# Patient Record
Sex: Male | Born: 1980 | Race: White | Hispanic: No | Marital: Single | Smoking: Never smoker
Health system: Southern US, Community
[De-identification: ages and names within clinical notes are randomized; demographics above are authoritative.]

## PROBLEM LIST (undated history)

## (undated) DIAGNOSIS — N201 Calculus of ureter: Secondary | ICD-10-CM

## (undated) HISTORY — PX: HERNIA REPAIR: SHX51

---

## 2009-12-10 ENCOUNTER — Encounter: Admission: RE | Admit: 2009-12-10 | Discharge: 2009-12-10 | Payer: Self-pay | Admitting: Family Medicine

## 2011-10-13 IMAGING — CR DG STERNUM 2+V
2 series · 2 of 2 positions shown · non-contrast
Comparison: None

CLINICAL DATA: Swelling over the lower aspect of the sternum.

STERNUM - 2+ VIEW

[view not recorded (1 of 2)]
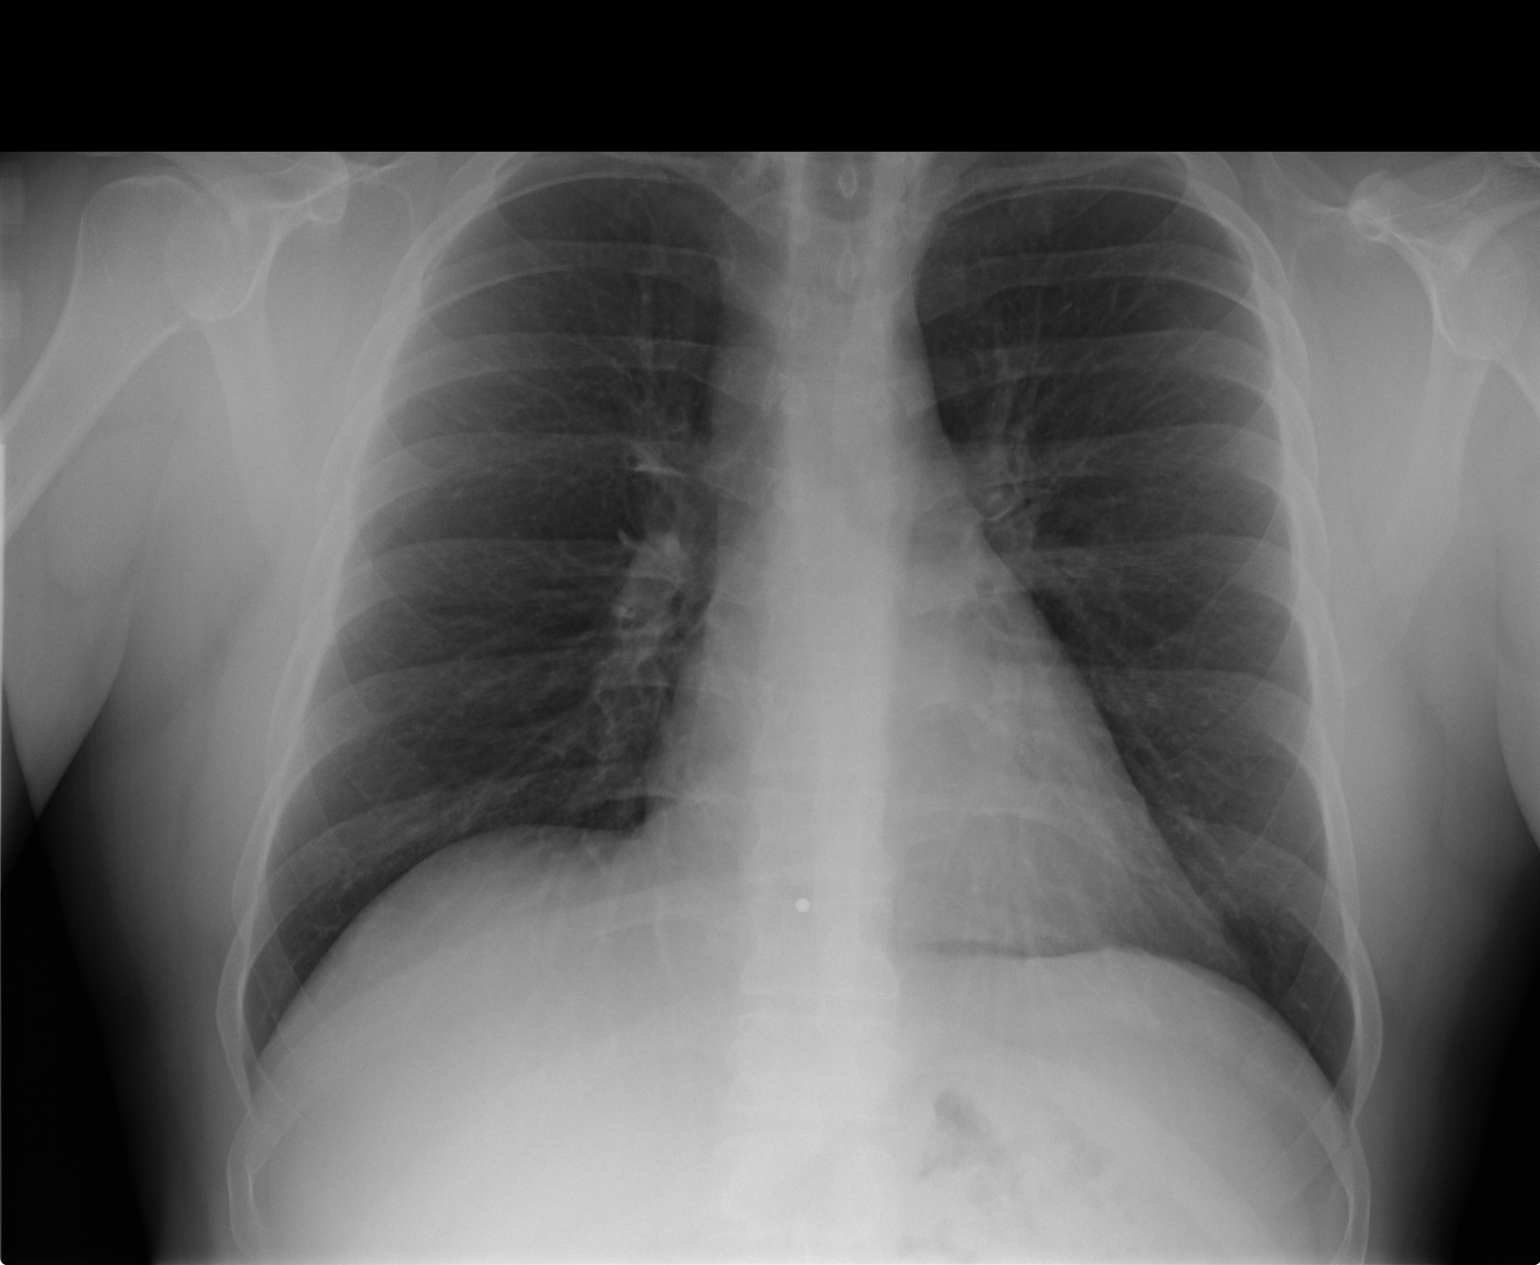

[view not recorded (2 of 2)]
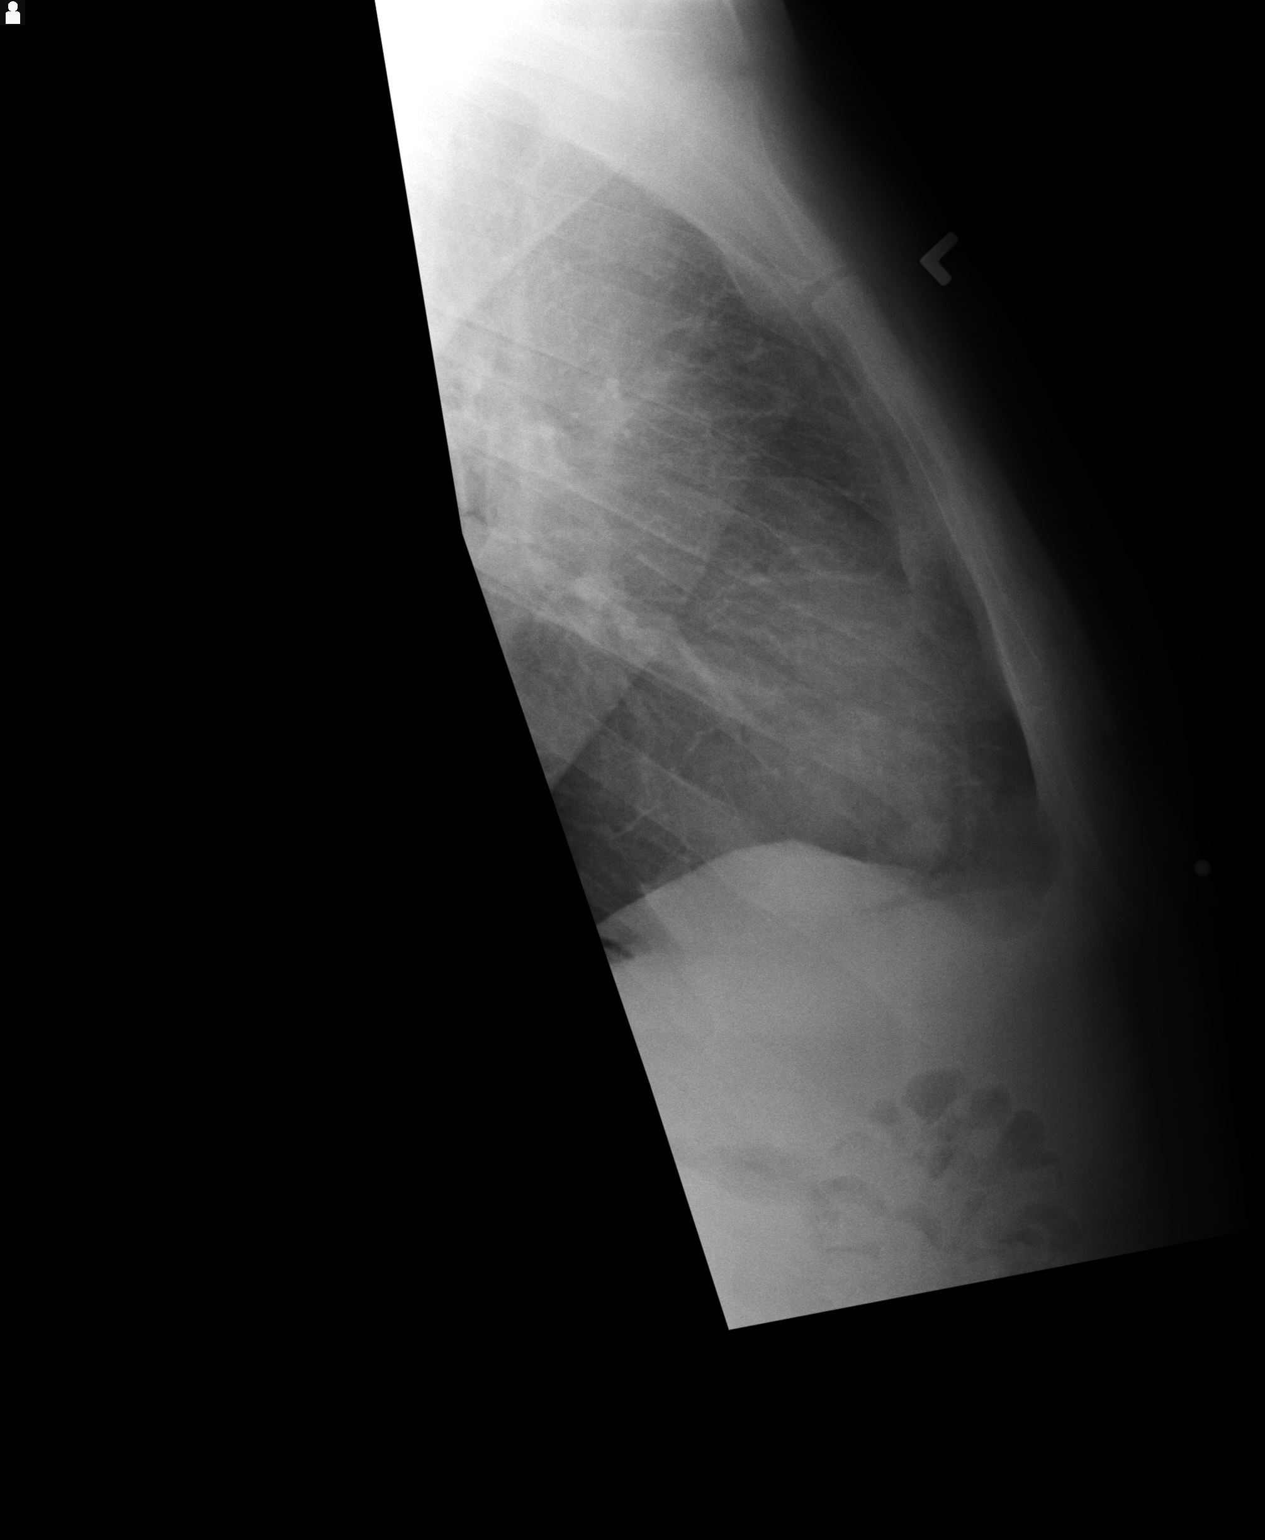

[2 of 2 positions shown; findings below may reference images not displayed]

FINDINGS: The cardiac silhouette, mediastinal and hilar contours
are within normal limits.  Lungs are clear.  The bony thorax is
intact.  No sternal abnormality is seen.  There is a somewhat
elongated xyphoid process which could account for the palpable
abnormality.
IMPRESSION: 1.  No acute cardiopulmonary findings.
2.  Elongated xyphoid process may correlate with the patient's
palpable abnormality.

## 2017-04-25 DIAGNOSIS — H6121 Impacted cerumen, right ear: Secondary | ICD-10-CM | POA: Diagnosis not present

## 2017-07-24 DIAGNOSIS — Z23 Encounter for immunization: Secondary | ICD-10-CM | POA: Diagnosis not present

## 2018-06-07 DIAGNOSIS — D225 Melanocytic nevi of trunk: Secondary | ICD-10-CM | POA: Diagnosis not present

## 2018-06-07 DIAGNOSIS — L720 Epidermal cyst: Secondary | ICD-10-CM | POA: Diagnosis not present

## 2018-06-07 DIAGNOSIS — L821 Other seborrheic keratosis: Secondary | ICD-10-CM | POA: Diagnosis not present

## 2018-12-07 DIAGNOSIS — K602 Anal fissure, unspecified: Secondary | ICD-10-CM | POA: Diagnosis not present

## 2019-05-31 ENCOUNTER — Other Ambulatory Visit: Payer: Self-pay

## 2019-05-31 ENCOUNTER — Other Ambulatory Visit: Payer: Self-pay | Admitting: Family Medicine

## 2019-05-31 DIAGNOSIS — Z20822 Contact with and (suspected) exposure to covid-19: Secondary | ICD-10-CM

## 2019-06-01 LAB — NOVEL CORONAVIRUS, NAA: SARS-CoV-2, NAA: NOT DETECTED

## 2022-09-23 ENCOUNTER — Other Ambulatory Visit (HOSPITAL_COMMUNITY): Payer: Self-pay

## 2022-09-23 MED ORDER — LISDEXAMFETAMINE DIMESYLATE 30 MG PO CAPS
30.0000 mg | ORAL_CAPSULE | Freq: Every day | ORAL | 0 refills | Status: DC
  Filled 2022-09-23 – 2022-10-26 (×2): qty 30, 30d supply, fill #0

## 2022-10-26 ENCOUNTER — Other Ambulatory Visit (HOSPITAL_COMMUNITY): Payer: Self-pay

## 2022-11-01 ENCOUNTER — Other Ambulatory Visit: Payer: Self-pay

## 2022-11-01 ENCOUNTER — Emergency Department (HOSPITAL_BASED_OUTPATIENT_CLINIC_OR_DEPARTMENT_OTHER)
Admission: EM | Admit: 2022-11-01 | Discharge: 2022-11-01 | Disposition: A | Discharge status: Home / Self Care | Payer: 59 | Attending: Emergency Medicine | Admitting: Emergency Medicine

## 2022-11-01 ENCOUNTER — Emergency Department (HOSPITAL_BASED_OUTPATIENT_CLINIC_OR_DEPARTMENT_OTHER): Payer: 59

## 2022-11-01 ENCOUNTER — Encounter (HOSPITAL_BASED_OUTPATIENT_CLINIC_OR_DEPARTMENT_OTHER): Payer: Self-pay

## 2022-11-01 DIAGNOSIS — R1032 Left lower quadrant pain: Secondary | ICD-10-CM | POA: Diagnosis present

## 2022-11-01 DIAGNOSIS — N132 Hydronephrosis with renal and ureteral calculous obstruction: Secondary | ICD-10-CM | POA: Diagnosis not present

## 2022-11-01 DIAGNOSIS — N201 Calculus of ureter: Secondary | ICD-10-CM

## 2022-11-01 HISTORY — DX: Calculus of ureter: N20.1

## 2022-11-01 LAB — URINALYSIS, ROUTINE W REFLEX MICROSCOPIC
Bacteria, UA: NONE SEEN
Bilirubin Urine: NEGATIVE
Glucose, UA: NEGATIVE mg/dL
Ketones, ur: NEGATIVE mg/dL
Leukocytes,Ua: NEGATIVE
Nitrite: NEGATIVE
Protein, ur: NEGATIVE mg/dL
Specific Gravity, Urine: 1.02 (ref 1.005–1.030)
pH: 5.5 (ref 5.0–8.0)

## 2022-11-01 MED ORDER — HYDROMORPHONE HCL 1 MG/ML IJ SOLN
1.0000 mg | Freq: Once | INTRAMUSCULAR | Status: AC
  Administered 2022-11-01: 1 mg via INTRAVENOUS
  Filled 2022-11-01: qty 1

## 2022-11-01 MED ORDER — ONDANSETRON 8 MG PO TBDP: 8.0000 mg | ORAL_TABLET | Freq: Three times a day (TID) | ORAL | 1 refills | Status: AC | PRN

## 2022-11-01 MED ORDER — HYDROMORPHONE HCL 2 MG PO TABS: 2.0000 mg | ORAL_TABLET | ORAL | 0 refills | Status: AC | PRN

## 2022-11-01 MED ORDER — ONDANSETRON HCL 4 MG/2ML IJ SOLN
4.0000 mg | Freq: Once | INTRAMUSCULAR | Status: AC
  Administered 2022-11-01: 4 mg via INTRAVENOUS
  Filled 2022-11-01: qty 2

## 2022-11-01 NOTE — ED Provider Notes (Signed)
[facility] Provider Note: Georgena Spurling, MD, FACEP  CSN: [id number] MRN: [id number] ARRIVAL: 11/01/22 at [place]  Flank Pain   HISTORY OF PRESENT ILLNESS  11/01/22 3:54 AM Jeffrey Phillips is a 42 y.o. male with left flank pain that radiates to his left lower abdomen.  It began yesterday evening.  He has had associated nausea.  He rates the pain as an 8 out of 10.  He is having difficulty urinating but he has not noted blood in his urine.  He has a history of kidney stones and states this is similar but worse.   Past Medical History:  Diagnosis Date   Ureterolithiasis     Past Surgical History:  Procedure Laterality Date   HERNIA REPAIR      History reviewed. No pertinent family history.  Social History   Tobacco Use   Smoking status: Never   Smokeless tobacco: Never    Prior to Admission medications   Medication Sig Start Date End Date Taking? Authorizing Provider  HYDROmorphone (DILAUDID) 2 MG tablet Take 1 tablet (2 mg total) by mouth every 4 (four) hours as needed for severe pain. 11/01/22  Yes Quinnley Colasurdo, MD  ondansetron (ZOFRAN-ODT) 8 MG disintegrating tablet Take 1 tablet (8 mg total) by mouth every 8 (eight) hours as needed for nausea or vomiting. 11/01/22  Yes Memphis Decoteau, MD  lisdexamfetamine (VYVANSE) 30 MG capsule Take 1 capsule (30 mg total) by mouth daily. 09/23/22       Allergies Patient has no known allergies.   REVIEW OF SYSTEMS  Negative except as noted here or in the History of Present Illness.   PHYSICAL EXAMINATION  Initial Vital Signs Blood pressure (!) 170/94, pulse 74, temperature 97.6 F (36.4 C), temperature source Oral, resp. rate 18, height '5\' 7"'$  (1.702 m), weight 104.3 kg, SpO2 98 %.  Examination General: Well-developed, well-nourished male in no acute distress; appearance consistent with age of record HENT: normocephalic; atraumatic Eyes: Normal appearance Neck: supple Heart: regular  rate and rhythm Lungs: clear to auscultation bilaterally Abdomen: soft; nondistended; mild left lower quadrant tenderness; bowel sounds present GU: Mild left CVA tenderness Extremities: No deformity; full range of motion; pulses normal Neurologic: Awake, alert and oriented; motor function intact in all extremities and symmetric; no facial droop Skin: Warm and dry Psychiatric: Normal mood and affect   RESULTS  Summary of this visit's results, reviewed and interpreted by myself:   EKG Interpretation  Date/Time:    Ventricular Rate:    PR Interval:    QRS Duration:   QT Interval:    QTC Calculation:   R Axis:     Text Interpretation:         Laboratory Studies: Results for orders placed or performed during the hospital encounter of 11/01/22 (from the past 24 hour(s))  Urinalysis, Routine w reflex microscopic     Status: Abnormal   Collection Time: 11/01/22  6:23 AM  Result Value Ref Range   Color, Urine YELLOW YELLOW   APPearance CLEAR CLEAR   Specific Gravity, Urine 1.020 1.005 - 1.030   pH 5.5 5.0 - 8.0   Glucose, UA NEGATIVE NEGATIVE mg/dL   Hgb urine dipstick LARGE (A) NEGATIVE   Bilirubin Urine NEGATIVE NEGATIVE   Ketones, ur NEGATIVE NEGATIVE mg/dL   Protein, ur NEGATIVE NEGATIVE mg/dL   Nitrite NEGATIVE NEGATIVE   Leukocytes,Ua NEGATIVE NEGATIVE   RBC / HPF 11-20 0 - 5 RBC/hpf   WBC, UA 0-5  0 - 5 WBC/hpf   Bacteria, UA NONE SEEN NONE SEEN   Squamous Epithelial / HPF 0-5 0 - 5 /HPF   Mucus PRESENT    Imaging Studies: CT Renal Stone Study  Result Date: 11/01/2022 CLINICAL DATA:  Left flank pain. EXAM: CT ABDOMEN AND PELVIS WITHOUT CONTRAST TECHNIQUE: Multidetector CT imaging of the abdomen and pelvis was performed following the standard protocol without IV contrast. RADIATION DOSE REDUCTION: This exam was performed according to the departmental dose-optimization program which includes automated exposure control, adjustment of the mA and/or kV according to patient  size and/or use of iterative reconstruction technique. COMPARISON:  None Available. FINDINGS: Lower chest: Lung bases are clear. The cardiac size is normal. There is no pericardial effusion. Small hiatal fat hernia. Hepatobiliary: The liver is 18 cm length with moderate to severe steatosis. No focal abnormality is seen without contrast. The gallbladder bile ducts are unremarkable. Pancreas: No focal abnormality is seen without contrast. Spleen: No focal abnormality is seen without contrast. The spleen slightly enlarged measuring 13.6 cm length. Small splenule noted medially. Adrenals/Urinary Tract: There is asymmetric left perirenal edema and mild left perinephric stranding. No contour deforming mass of the kidneys is seen. There is no adrenal mass. There are scattered bilateral punctate nonobstructive caliceal stones. On the left, there is mild hydroureteronephrosis due to a 2 mm UVJ stone. Bladder is contracted and could be mildly thickened versus exaggerated from nondistention. Stomach/Bowel: No dilatation or wall thickening including the appendix. Uncomplicated sigmoid diverticulosis. Vascular/Lymphatic: Scattered pelvic phleboliths. No aortic calcification or dilatation. There is no adenopathy. Reproductive: Prostate is unremarkable. Other: There is no incarcerated hernia. There is no free fluid, free hemorrhage or free air. Musculoskeletal: No acute or significant osseous findings. IMPRESSION: 1. 2 mm left UVJ stone with mild obstructive uropathy. 2. Additional nonobstructive micronephrolithiasis. 3. Moderate to severe hepatic steatosis. 4. Mild hepatosplenomegaly. 5. Cystitis versus bladder nondistention. 6. Uncomplicated sigmoid diverticulosis. 7. Small hiatal fat hernia. Electronically Signed   By: Telford Nab M.D.   On: 11/01/2022 05:41    ED COURSE and MDM  Nursing notes, initial and subsequent vitals signs, including pulse oximetry, reviewed and interpreted by myself.  Vitals:   11/01/22 0349  11/01/22 0351 11/01/22 0500 11/01/22 0600  BP:  (!) 170/94 123/66 124/80  Pulse:  74 73 63  Resp:  '18 18 17  '$ Temp:  97.6 F (36.4 C)    TempSrc:  Oral    SpO2:  98% 95% 95%  Weight: 104.3 kg     Height: '5\' 7"'$  (1.702 m)      Medications  ondansetron (ZOFRAN) injection 4 mg (4 mg Intravenous Given 11/01/22 0400)  HYDROmorphone (DILAUDID) injection 1 mg (1 mg Intravenous Given 11/01/22 0401)   4:43 AM Left UVJ stone seen on CT scan.  This is consistent with the patient's clinical presentation.  6:45 AM No evidence of urinary tract infection on urinalysis.   PROCEDURES  Procedures   ED DIAGNOSES     ICD-10-CM   1. Ureterolithiasis  N20.1          Jaquaya Coyle, MD 11/01/22 [phone]

## 2022-11-01 NOTE — ED Triage Notes (Signed)
POV from home, amb to triage, A&O x 4.  C/o left sided flank pain that radiates to lower abd that began last night, associated nausea, denies other symptoms.

## 2022-11-03 ENCOUNTER — Other Ambulatory Visit (HOSPITAL_COMMUNITY): Payer: Self-pay

## 2022-11-11 ENCOUNTER — Other Ambulatory Visit (HOSPITAL_COMMUNITY): Payer: Self-pay

## 2022-11-11 MED ORDER — LISDEXAMFETAMINE DIMESYLATE 30 MG PO CAPS: 30.0000 mg | ORAL_CAPSULE | Freq: Every day | ORAL | 0 refills | Status: AC

## 2022-12-02 ENCOUNTER — Other Ambulatory Visit (HOSPITAL_COMMUNITY): Payer: Self-pay

## 2022-12-02 MED ORDER — LISDEXAMFETAMINE DIMESYLATE 30 MG PO CAPS
30.0000 mg | ORAL_CAPSULE | Freq: Every day | ORAL | 0 refills | Status: AC
  Filled 2022-12-02: qty 30, 30d supply, fill #0

## 2023-01-05 ENCOUNTER — Other Ambulatory Visit (HOSPITAL_COMMUNITY): Payer: Self-pay

## 2023-01-05 MED ORDER — LISDEXAMFETAMINE DIMESYLATE 30 MG PO CAPS
30.0000 mg | ORAL_CAPSULE | Freq: Every day | ORAL | 0 refills | Status: AC
  Filled 2023-01-05: qty 30, 30d supply, fill #0

## 2023-01-10 ENCOUNTER — Other Ambulatory Visit (HOSPITAL_COMMUNITY): Payer: Self-pay

## 2023-01-18 ENCOUNTER — Other Ambulatory Visit (HOSPITAL_COMMUNITY): Payer: Self-pay

## 2023-02-15 ENCOUNTER — Other Ambulatory Visit (HOSPITAL_COMMUNITY): Payer: Self-pay

## 2023-02-15 MED ORDER — LISDEXAMFETAMINE DIMESYLATE 30 MG PO CAPS
30.0000 mg | ORAL_CAPSULE | Freq: Every day | ORAL | 0 refills | Status: AC
  Filled 2023-02-15: qty 10, 10d supply, fill #0
  Filled 2023-02-15: qty 20, 20d supply, fill #0

## 2023-03-17 ENCOUNTER — Other Ambulatory Visit (HOSPITAL_COMMUNITY): Payer: Self-pay

## 2023-03-17 MED ORDER — LISDEXAMFETAMINE DIMESYLATE 30 MG PO CAPS
30.0000 mg | ORAL_CAPSULE | Freq: Every day | ORAL | 0 refills | Status: AC
  Filled 2023-03-17: qty 30, 30d supply, fill #0

## 2023-03-18 ENCOUNTER — Other Ambulatory Visit: Payer: Self-pay

## 2023-04-18 ENCOUNTER — Other Ambulatory Visit: Payer: Self-pay

## 2023-04-18 ENCOUNTER — Other Ambulatory Visit (HOSPITAL_COMMUNITY): Payer: Self-pay

## 2023-04-18 MED ORDER — LISDEXAMFETAMINE DIMESYLATE 30 MG PO CAPS
30.0000 mg | ORAL_CAPSULE | Freq: Every day | ORAL | 0 refills | Status: DC
  Filled 2023-04-18: qty 30, 30d supply, fill #0

## 2023-05-19 ENCOUNTER — Other Ambulatory Visit (HOSPITAL_COMMUNITY): Payer: Self-pay

## 2023-05-19 MED ORDER — LISDEXAMFETAMINE DIMESYLATE 30 MG PO CAPS
30.0000 mg | ORAL_CAPSULE | Freq: Every day | ORAL | 0 refills | Status: DC
  Filled 2023-05-19: qty 30, 30d supply, fill #0

## 2023-06-22 ENCOUNTER — Other Ambulatory Visit (HOSPITAL_COMMUNITY): Payer: Self-pay

## 2023-06-22 MED ORDER — LISDEXAMFETAMINE DIMESYLATE 30 MG PO CAPS
30.0000 mg | ORAL_CAPSULE | Freq: Every day | ORAL | 0 refills | Status: DC
  Filled 2023-06-22: qty 30, 30d supply, fill #0

## 2023-06-28 DIAGNOSIS — F411 Generalized anxiety disorder: Secondary | ICD-10-CM | POA: Diagnosis not present

## 2023-06-28 DIAGNOSIS — F988 Other specified behavioral and emotional disorders with onset usually occurring in childhood and adolescence: Secondary | ICD-10-CM | POA: Diagnosis not present

## 2023-06-28 DIAGNOSIS — Z13220 Encounter for screening for lipoid disorders: Secondary | ICD-10-CM | POA: Diagnosis not present

## 2023-06-28 DIAGNOSIS — K219 Gastro-esophageal reflux disease without esophagitis: Secondary | ICD-10-CM | POA: Diagnosis not present

## 2023-06-28 DIAGNOSIS — K602 Anal fissure, unspecified: Secondary | ICD-10-CM | POA: Diagnosis not present

## 2023-06-28 DIAGNOSIS — Z0000 Encounter for general adult medical examination without abnormal findings: Secondary | ICD-10-CM | POA: Diagnosis not present

## 2023-06-28 DIAGNOSIS — Z125 Encounter for screening for malignant neoplasm of prostate: Secondary | ICD-10-CM | POA: Diagnosis not present

## 2023-06-28 DIAGNOSIS — Z23 Encounter for immunization: Secondary | ICD-10-CM | POA: Diagnosis not present

## 2023-07-21 ENCOUNTER — Other Ambulatory Visit (HOSPITAL_COMMUNITY): Payer: Self-pay

## 2023-07-21 MED ORDER — LISDEXAMFETAMINE DIMESYLATE 30 MG PO CAPS
30.0000 mg | ORAL_CAPSULE | Freq: Every day | ORAL | 0 refills | Status: DC
  Filled 2023-07-21: qty 10, 10d supply, fill #0
  Filled 2023-07-21: qty 20, 20d supply, fill #0

## 2023-08-23 ENCOUNTER — Other Ambulatory Visit (HOSPITAL_COMMUNITY): Payer: Self-pay

## 2023-08-23 MED ORDER — LISDEXAMFETAMINE DIMESYLATE 30 MG PO CAPS
30.0000 mg | ORAL_CAPSULE | Freq: Every day | ORAL | 0 refills | Status: DC
  Filled 2023-08-23: qty 30, 30d supply, fill #0

## 2023-09-27 ENCOUNTER — Other Ambulatory Visit (HOSPITAL_COMMUNITY): Payer: Self-pay

## 2023-09-27 MED ORDER — LISDEXAMFETAMINE DIMESYLATE 30 MG PO CAPS
30.0000 mg | ORAL_CAPSULE | Freq: Every day | ORAL | 0 refills | Status: DC
  Filled 2023-09-27: qty 30, 30d supply, fill #0

## 2023-10-01 DIAGNOSIS — R519 Headache, unspecified: Secondary | ICD-10-CM | POA: Diagnosis not present

## 2023-10-01 DIAGNOSIS — J069 Acute upper respiratory infection, unspecified: Secondary | ICD-10-CM | POA: Diagnosis not present

## 2023-10-01 DIAGNOSIS — J029 Acute pharyngitis, unspecified: Secondary | ICD-10-CM | POA: Diagnosis not present

## 2023-10-01 DIAGNOSIS — J020 Streptococcal pharyngitis: Secondary | ICD-10-CM | POA: Diagnosis not present

## 2023-10-01 DIAGNOSIS — Z03818 Encounter for observation for suspected exposure to other biological agents ruled out: Secondary | ICD-10-CM | POA: Diagnosis not present

## 2023-11-02 ENCOUNTER — Other Ambulatory Visit (HOSPITAL_COMMUNITY): Payer: Self-pay

## 2023-11-02 MED ORDER — LISDEXAMFETAMINE DIMESYLATE 30 MG PO CAPS
30.0000 mg | ORAL_CAPSULE | Freq: Every day | ORAL | 0 refills | Status: DC
  Filled 2023-11-02: qty 30, 30d supply, fill #0

## 2023-11-30 ENCOUNTER — Other Ambulatory Visit (HOSPITAL_COMMUNITY): Payer: Self-pay

## 2023-11-30 MED ORDER — LISDEXAMFETAMINE DIMESYLATE 30 MG PO CAPS
30.0000 mg | ORAL_CAPSULE | Freq: Every day | ORAL | 0 refills | Status: DC
  Filled 2023-12-02: qty 30, 30d supply, fill #0

## 2023-12-02 ENCOUNTER — Other Ambulatory Visit (HOSPITAL_COMMUNITY): Payer: Self-pay

## 2024-01-03 ENCOUNTER — Other Ambulatory Visit (HOSPITAL_COMMUNITY): Payer: Self-pay

## 2024-01-03 MED ORDER — LISDEXAMFETAMINE DIMESYLATE 30 MG PO CAPS
30.0000 mg | ORAL_CAPSULE | Freq: Every day | ORAL | 0 refills | Status: AC
  Filled 2024-01-03: qty 30, 30d supply, fill #0

## 2024-01-11 ENCOUNTER — Other Ambulatory Visit (HOSPITAL_COMMUNITY): Payer: Self-pay

## 2024-04-10 ENCOUNTER — Other Ambulatory Visit (HOSPITAL_COMMUNITY): Payer: Self-pay

## 2024-04-10 MED ORDER — SERTRALINE HCL 100 MG PO TABS
100.0000 mg | ORAL_TABLET | Freq: Every day | ORAL | 3 refills | Status: AC
  Filled 2024-04-10: qty 90, 90d supply, fill #0
  Filled 2024-10-25: qty 90, 90d supply, fill #1

## 2024-07-03 ENCOUNTER — Other Ambulatory Visit: Payer: Self-pay

## 2024-07-03 DIAGNOSIS — R7301 Impaired fasting glucose: Secondary | ICD-10-CM | POA: Diagnosis not present

## 2024-07-03 DIAGNOSIS — M5459 Other low back pain: Secondary | ICD-10-CM | POA: Diagnosis not present

## 2024-07-03 DIAGNOSIS — Z23 Encounter for immunization: Secondary | ICD-10-CM | POA: Diagnosis not present

## 2024-07-03 DIAGNOSIS — F419 Anxiety disorder, unspecified: Secondary | ICD-10-CM | POA: Diagnosis not present

## 2024-07-03 DIAGNOSIS — K580 Irritable bowel syndrome with diarrhea: Secondary | ICD-10-CM | POA: Diagnosis not present

## 2024-07-03 DIAGNOSIS — Z0000 Encounter for general adult medical examination without abnormal findings: Secondary | ICD-10-CM | POA: Diagnosis not present

## 2024-07-03 DIAGNOSIS — K219 Gastro-esophageal reflux disease without esophagitis: Secondary | ICD-10-CM | POA: Diagnosis not present

## 2024-07-03 MED ORDER — SERTRALINE HCL 100 MG PO TABS
100.0000 mg | ORAL_TABLET | Freq: Every day | ORAL | 3 refills | Status: AC
  Filled 2024-07-03: qty 90, 90d supply, fill #0
  Filled 2024-10-09: qty 90, 90d supply, fill #1

## 2024-07-05 ENCOUNTER — Other Ambulatory Visit (HOSPITAL_COMMUNITY): Payer: Self-pay

## 2024-07-05 ENCOUNTER — Other Ambulatory Visit: Payer: Self-pay

## 2024-10-04 ENCOUNTER — Other Ambulatory Visit (HOSPITAL_COMMUNITY): Payer: Self-pay

## 2024-10-04 DIAGNOSIS — E119 Type 2 diabetes mellitus without complications: Secondary | ICD-10-CM | POA: Diagnosis not present

## 2024-10-04 DIAGNOSIS — F419 Anxiety disorder, unspecified: Secondary | ICD-10-CM | POA: Diagnosis not present

## 2024-10-04 DIAGNOSIS — K219 Gastro-esophageal reflux disease without esophagitis: Secondary | ICD-10-CM | POA: Diagnosis not present

## 2024-10-04 DIAGNOSIS — K580 Irritable bowel syndrome with diarrhea: Secondary | ICD-10-CM | POA: Diagnosis not present

## 2024-10-04 MED ORDER — MOUNJARO 2.5 MG/0.5ML ~~LOC~~ SOAJ
2.5000 mg | SUBCUTANEOUS | 0 refills | Status: AC
  Filled 2024-10-04 – 2024-11-23 (×3): qty 2, 28d supply, fill #0

## 2024-10-05 ENCOUNTER — Other Ambulatory Visit (HOSPITAL_COMMUNITY): Payer: Self-pay

## 2024-10-09 ENCOUNTER — Other Ambulatory Visit (HOSPITAL_COMMUNITY): Payer: Self-pay

## 2024-10-19 ENCOUNTER — Other Ambulatory Visit (HOSPITAL_COMMUNITY): Payer: Self-pay

## 2024-10-22 ENCOUNTER — Other Ambulatory Visit (HOSPITAL_COMMUNITY): Payer: Self-pay

## 2024-10-23 ENCOUNTER — Encounter (HOSPITAL_COMMUNITY): Payer: Self-pay

## 2024-10-23 ENCOUNTER — Other Ambulatory Visit (HOSPITAL_COMMUNITY): Payer: Self-pay

## 2024-10-23 MED ORDER — MOUNJARO 2.5 MG/0.5ML ~~LOC~~ SOAJ
2.5000 mg | SUBCUTANEOUS | 0 refills | Status: AC
  Filled 2024-10-23 – 2024-11-01 (×2): qty 2, 28d supply, fill #0

## 2024-10-25 ENCOUNTER — Other Ambulatory Visit (HOSPITAL_COMMUNITY): Payer: Self-pay

## 2024-11-01 ENCOUNTER — Other Ambulatory Visit (HOSPITAL_COMMUNITY): Payer: Self-pay

## 2024-11-23 ENCOUNTER — Other Ambulatory Visit (HOSPITAL_COMMUNITY): Payer: Self-pay
